# Patient Record
Sex: Female | Born: 1960 | Race: White | Hispanic: No | Marital: Married | Smoking: Never smoker
Health system: Southern US, Community
[De-identification: ages and names within clinical notes are randomized; demographics above are authoritative.]

## PROBLEM LIST (undated history)

## (undated) DIAGNOSIS — E669 Obesity, unspecified: Secondary | ICD-10-CM

## (undated) DIAGNOSIS — I10 Essential (primary) hypertension: Secondary | ICD-10-CM

## (undated) DIAGNOSIS — E785 Hyperlipidemia, unspecified: Secondary | ICD-10-CM

## (undated) DIAGNOSIS — H409 Unspecified glaucoma: Secondary | ICD-10-CM

## (undated) HISTORY — PX: FRACTURE SURGERY: SHX138

## (undated) HISTORY — PX: CHOLECYSTECTOMY: SHX55

## (undated) HISTORY — PX: TONSILLECTOMY: SUR1361

## (undated) HISTORY — PX: MANDIBLE SURGERY: SHX707

---

## 2005-05-07 ENCOUNTER — Ambulatory Visit: Payer: Self-pay

## 2008-08-21 ENCOUNTER — Ambulatory Visit: Payer: Self-pay

## 2009-12-16 ENCOUNTER — Ambulatory Visit: Payer: Self-pay | Admitting: Unknown Physician Specialty

## 2010-05-12 ENCOUNTER — Ambulatory Visit: Payer: Self-pay | Admitting: Gastroenterology

## 2011-05-28 ENCOUNTER — Ambulatory Visit: Payer: Self-pay | Admitting: Unknown Physician Specialty

## 2012-06-14 ENCOUNTER — Ambulatory Visit: Payer: Self-pay

## 2013-10-20 ENCOUNTER — Ambulatory Visit: Payer: Self-pay

## 2014-12-04 ENCOUNTER — Other Ambulatory Visit: Payer: Self-pay | Admitting: Unknown Physician Specialty

## 2014-12-04 DIAGNOSIS — Z1231 Encounter for screening mammogram for malignant neoplasm of breast: Secondary | ICD-10-CM

## 2014-12-07 ENCOUNTER — Ambulatory Visit
Admission: RE | Admit: 2014-12-07 | Discharge: 2014-12-07 | Disposition: A | Discharge status: Home / Self Care | Payer: BC Managed Care – PPO | Source: Ambulatory Visit | Attending: Unknown Physician Specialty | Admitting: Unknown Physician Specialty

## 2014-12-07 DIAGNOSIS — Z1231 Encounter for screening mammogram for malignant neoplasm of breast: Secondary | ICD-10-CM | POA: Diagnosis present

## 2015-08-16 ENCOUNTER — Encounter: Payer: Self-pay | Admitting: *Deleted

## 2015-08-19 ENCOUNTER — Ambulatory Visit
Admission: RE | Admit: 2015-08-19 | Discharge: 2015-08-19 | Disposition: A | Discharge status: Home / Self Care | Payer: BC Managed Care – PPO | Source: Ambulatory Visit | Attending: Gastroenterology | Admitting: Gastroenterology

## 2015-08-19 ENCOUNTER — Encounter: Payer: Self-pay | Admitting: *Deleted

## 2015-08-19 ENCOUNTER — Encounter
Admission: RE | Disposition: A | Discharge status: Home / Self Care | Payer: Self-pay | Source: Ambulatory Visit | Attending: Gastroenterology

## 2015-08-19 ENCOUNTER — Ambulatory Visit: Payer: BC Managed Care – PPO | Admitting: Anesthesiology

## 2015-08-19 DIAGNOSIS — Z800 Family history of malignant neoplasm of digestive organs: Secondary | ICD-10-CM | POA: Diagnosis present

## 2015-08-19 DIAGNOSIS — Z6841 Body Mass Index (BMI) 40.0 and over, adult: Secondary | ICD-10-CM | POA: Diagnosis not present

## 2015-08-19 DIAGNOSIS — Z1211 Encounter for screening for malignant neoplasm of colon: Secondary | ICD-10-CM | POA: Diagnosis not present

## 2015-08-19 DIAGNOSIS — D124 Benign neoplasm of descending colon: Secondary | ICD-10-CM | POA: Diagnosis not present

## 2015-08-19 DIAGNOSIS — H409 Unspecified glaucoma: Secondary | ICD-10-CM | POA: Insufficient documentation

## 2015-08-19 DIAGNOSIS — E785 Hyperlipidemia, unspecified: Secondary | ICD-10-CM | POA: Insufficient documentation

## 2015-08-19 DIAGNOSIS — Z880 Allergy status to penicillin: Secondary | ICD-10-CM | POA: Insufficient documentation

## 2015-08-19 DIAGNOSIS — I10 Essential (primary) hypertension: Secondary | ICD-10-CM | POA: Insufficient documentation

## 2015-08-19 DIAGNOSIS — Z9889 Other specified postprocedural states: Secondary | ICD-10-CM | POA: Insufficient documentation

## 2015-08-19 HISTORY — DX: Essential (primary) hypertension: I10

## 2015-08-19 HISTORY — PX: COLONOSCOPY WITH PROPOFOL: SHX5780

## 2015-08-19 HISTORY — DX: Hyperlipidemia, unspecified: E78.5

## 2015-08-19 HISTORY — DX: Unspecified glaucoma: H40.9

## 2015-08-19 SURGERY — COLONOSCOPY WITH PROPOFOL
Anesthesia: General

## 2015-08-19 MED ORDER — PROPOFOL 500 MG/50ML IV EMUL
INTRAVENOUS | Status: DC | PRN
  Administered 2015-08-19: 150 ug/kg/min via INTRAVENOUS

## 2015-08-19 MED ORDER — SODIUM CHLORIDE 0.9 % IV SOLN: INTRAVENOUS | Status: DC

## 2015-08-19 MED ORDER — SODIUM CHLORIDE 0.9 % IV SOLN
INTRAVENOUS | Status: DC
Start: 2015-08-19 — End: 2015-08-19
  Administered 2015-08-19: 1000 mL via INTRAVENOUS

## 2015-08-19 MED ORDER — PROPOFOL 10 MG/ML IV BOLUS
INTRAVENOUS | Status: DC | PRN
  Administered 2015-08-19: 100 mg via INTRAVENOUS

## 2015-08-19 NOTE — Transfer of Care (Signed)
Immediate Anesthesia Transfer of Care Note  Patient: Erin Holmes  Procedure(s) Performed: Procedure(s): COLONOSCOPY WITH PROPOFOL (N/A)  Patient Location: Endoscopy Unit  Anesthesia Type:General  Level of Consciousness: sedated  Airway & Oxygen Therapy: Patient Spontanous Breathing and Patient connected to nasal cannula oxygen  Post-op Assessment: Report given to RN and Post -op Vital signs reviewed and stable  Post vital signs: Reviewed and stable  Last Vitals:  Filed Vitals:   08/19/15 1450 08/19/15 1500  BP: 110/74 125/70  Pulse: 77 75  Temp:    Resp: 31 20    Complications: No apparent anesthesia complications

## 2015-08-19 NOTE — Anesthesia Postprocedure Evaluation (Signed)
Anesthesia Post Note  Patient: Erin Holmes  Procedure(s) Performed: Procedure(s) (LRB): COLONOSCOPY WITH PROPOFOL (N/A)  Patient location during evaluation: Endoscopy Anesthesia Type: General Level of consciousness: awake and alert Pain management: pain level controlled Vital Signs Assessment: post-procedure vital signs reviewed and stable Respiratory status: spontaneous breathing, nonlabored ventilation, respiratory function stable and patient connected to nasal cannula oxygen Cardiovascular status: blood pressure returned to baseline and stable Postop Assessment: no signs of nausea or vomiting Anesthetic complications: no    Last Vitals:  Filed Vitals:   08/19/15 1450 08/19/15 1500  BP: 110/74 125/70  Pulse: 77 75  Temp:    Resp: 31 20    Last Pain:  Filed Vitals:   08/19/15 1501  PainSc: 4                  Broadus John K Ezariah Nace

## 2015-08-19 NOTE — Anesthesia Preprocedure Evaluation (Signed)
Anesthesia Evaluation  Patient identified by MRN, date of birth, ID band Patient awake    Reviewed: Allergy & Precautions, H&P , NPO status , Patient's Chart, lab work & pertinent test results  History of Anesthesia Complications Negative for: history of anesthetic complications  Airway Mallampati: III  TM Distance: >3 FB Neck ROM: full    Dental  (+) Poor Dentition, Chipped, Caps   Pulmonary neg pulmonary ROS, neg shortness of breath,    Pulmonary exam normal breath sounds clear to auscultation       Cardiovascular Exercise Tolerance: Good hypertension, (-) angina(-) Past MI and (-) DOE Normal cardiovascular exam Rhythm:regular Rate:Normal     Neuro/Psych negative neurological ROS  negative psych ROS   GI/Hepatic negative GI ROS, Neg liver ROS, neg GERD  ,  Endo/Other  Morbid obesity  Renal/GU negative Renal ROS  negative genitourinary   Musculoskeletal   Abdominal   Peds  Hematology negative hematology ROS (+)   Anesthesia Other Findings Past Medical History:   Hypertension                                                 Hyperlipemia                                                 Glaucoma                                                    Past Surgical History:   TONSILLECTOMY                                                 MANDIBLE SURGERY                                              FRACTURE SURGERY                                              CHOLECYSTECTOMY                                               CESAREAN SECTION                                             BMI    Body Mass Index   41.60 kg/m 2      Reproductive/Obstetrics negative OB ROS  Anesthesia Physical Anesthesia Plan  ASA: III  Anesthesia Plan: General   Post-op Pain Management:    Induction:   Airway Management Planned:   Additional Equipment:   Intra-op Plan:    Post-operative Plan:   Informed Consent: I have reviewed the patients History and Physical, chart, labs and discussed the procedure including the risks, benefits and alternatives for the proposed anesthesia with the patient or authorized representative who has indicated his/her understanding and acceptance.   Dental Advisory Given  Plan Discussed with: Anesthesiologist, CRNA and Surgeon  Anesthesia Plan Comments:         Anesthesia Quick Evaluation

## 2015-08-19 NOTE — H&P (Signed)
Outpatient short stay form Pre-procedure 08/19/2015 1:57 PM Lollie Sails MD  Primary Physician: Rolan Lipa NP  Reason for visit:  Colonoscopy  History of present illness:  Patient is a 55 year old female presenting today for screening colonoscopy. She has a family history of colon cancer in her mother. She tolerated her preparation well. She takes no anticoagulation medications or aspirin products.    Current facility-administered medications:  .  0.9 %  sodium chloride infusion, , Intravenous, Continuous, Lollie Sails, MD, Last Rate: 20 mL/hr at 08/19/15 1354, 1,000 mL at 08/19/15 1354 .  0.9 %  sodium chloride infusion, , Intravenous, Continuous, Lollie Sails, MD  Prescriptions prior to admission  Medication Sig Dispense Refill Last Dose  . albuterol (PROVENTIL HFA;VENTOLIN HFA) 108 (90 Base) MCG/ACT inhaler Inhale 2 puffs into the lungs every 6 (six) hours as needed for wheezing or shortness of breath.     . lisinopril-hydrochlorothiazide (PRINZIDE,ZESTORETIC) 20-12.5 MG tablet Take 1 tablet by mouth daily.    at 0800  . loratadine (CLARITIN) 10 MG tablet Take 10 mg by mouth daily.   08/19/2015 at 0800 time  . Multiple Vitamin (MULTIVITAMIN) tablet Take 1 tablet by mouth daily.     . OMEGA 3-6-9 FATTY ACIDS PO Take 300 mg by mouth daily.     . timolol (BETIMOL) 0.25 % ophthalmic solution Place 1 drop into both eyes 2 (two) times daily.        Allergies  Allergen Reactions  . Penicillins Rash     Past Medical History  Diagnosis Date  . Hypertension   . Hyperlipemia   . Glaucoma     Review of systems:      Physical Exam    Heart and lungs: Regular rate and rhythm without rub or gallop, lungs are bilaterally clear.    HEENT: Normocephalic atraumatic eyes are anicteric    Other:     Pertinant exam for procedure: Soft nontender nondistended bowel sounds positive normoactive.    Planned proceedures: Colonoscopy with indicated procedures. I have  discussed the risks benefits and complications of procedures to include not limited to bleeding, infection, perforation and the risk of sedation and the patient wishes to proceed.    Lollie Sails, MD Gastroenterology 08/19/2015  1:57 PM

## 2015-08-19 NOTE — Op Note (Signed)
[facility] Gastroenterology Patient Name: Erin Holmes Procedure Date: 08/19/2015 2:02 PM MRN: [id number] Account #: [id number] Date of Birth: Dec 24, 1960 Admit Type: Outpatient Age: 55 Room: [facility] ENDO ROOM 3 Gender: Female Note Status: Finalized Procedure:         Colonoscopy Indications:       Family history of colon cancer in a first-degree relative Providers:         Lollie Sails, MD Referring MD:      Precious Bard, MD (Referring MD) Medicines:         Monitored Anesthesia Care Complications:     No immediate complications. Procedure:         Pre-Anesthesia Assessment:                    - ASA Grade Assessment: II - A patient with mild systemic                     disease.                    After obtaining informed consent, the colonoscope was                     passed under direct vision. Throughout the procedure, the                     patient's blood pressure, pulse, and oxygen saturations                     were monitored continuously. The Colonoscope was                     introduced through the anus and advanced to the the cecum,                     identified by appendiceal orifice and ileocecal valve. The                     quality of the bowel preparation was good. Findings:      A 1 mm polyp was found in the descending colon. The polyp was sessile.       The polyp was removed with a cold biopsy forceps. Resection and       retrieval were complete.      The retroflexed view of the distal rectum and anal verge was normal and       showed no anal or rectal abnormalities.      The digital rectal exam was normal. Impression:        - One 1 mm polyp in the descending colon. Resected and                     retrieved.                    - The distal rectum and anal verge are normal on                     retroflexion view. Recommendation:    - Await pathology results.                    - Telephone GI clinic for pathology results in  1 week. Procedure Code(s): --- Professional ---  45380, Colonoscopy, flexible; with biopsy, single or                     multiple Diagnosis Code(s): --- Professional ---                    D12.4, Benign neoplasm of descending colon                    Z80.0, Family history of malignant neoplasm of digestive                     organs CPT copyright 2014 American Medical Association. All rights reserved. The codes documented in this report are preliminary and upon coder review may  be revised to meet current compliance requirements. Lollie Sails, MD 08/19/2015 2:29:17 PM This report has been signed electronically. Number of Addenda: 0 Note Initiated On: 08/19/2015 2:02 PM Scope Withdrawal Time: 0 hours 8 minutes 0 seconds  Total Procedure Duration: 0 hours 19 minutes 45 seconds       [facility]

## 2015-08-20 ENCOUNTER — Encounter: Payer: Self-pay | Admitting: Gastroenterology

## 2015-08-21 LAB — SURGICAL PATHOLOGY

## 2015-10-31 ENCOUNTER — Other Ambulatory Visit: Payer: Self-pay | Admitting: Unknown Physician Specialty

## 2016-03-03 ENCOUNTER — Other Ambulatory Visit: Payer: Self-pay | Admitting: Physician Assistant

## 2016-03-03 DIAGNOSIS — Z1231 Encounter for screening mammogram for malignant neoplasm of breast: Secondary | ICD-10-CM

## 2016-03-24 ENCOUNTER — Ambulatory Visit: Payer: BC Managed Care – PPO

## 2016-03-25 ENCOUNTER — Ambulatory Visit
Admission: RE | Admit: 2016-03-25 | Discharge: 2016-03-25 | Disposition: A | Discharge status: Home / Self Care | Payer: BC Managed Care – PPO | Source: Ambulatory Visit | Attending: Physician Assistant | Admitting: Physician Assistant

## 2016-03-25 DIAGNOSIS — Z1231 Encounter for screening mammogram for malignant neoplasm of breast: Secondary | ICD-10-CM | POA: Insufficient documentation

## 2016-03-25 DIAGNOSIS — R928 Other abnormal and inconclusive findings on diagnostic imaging of breast: Secondary | ICD-10-CM | POA: Insufficient documentation

## 2016-03-27 ENCOUNTER — Other Ambulatory Visit: Payer: Self-pay | Admitting: Physician Assistant

## 2016-03-27 DIAGNOSIS — N6310 Unspecified lump in the right breast, unspecified quadrant: Secondary | ICD-10-CM

## 2016-04-16 ENCOUNTER — Ambulatory Visit
Admission: RE | Admit: 2016-04-16 | Discharge: 2016-04-16 | Disposition: A | Discharge status: Home / Self Care | Payer: BC Managed Care – PPO | Source: Ambulatory Visit | Attending: Physician Assistant | Admitting: Physician Assistant

## 2016-04-16 DIAGNOSIS — N6310 Unspecified lump in the right breast, unspecified quadrant: Secondary | ICD-10-CM | POA: Diagnosis not present

## 2016-09-07 ENCOUNTER — Other Ambulatory Visit: Payer: Self-pay | Admitting: Physician Assistant

## 2016-09-07 DIAGNOSIS — N6310 Unspecified lump in the right breast, unspecified quadrant: Secondary | ICD-10-CM

## 2016-10-16 ENCOUNTER — Other Ambulatory Visit: Payer: BC Managed Care – PPO

## 2016-10-16 ENCOUNTER — Inpatient Hospital Stay: Admission: RE | Admit: 2016-10-16 | Payer: BC Managed Care – PPO | Source: Ambulatory Visit

## 2016-10-29 ENCOUNTER — Ambulatory Visit
Admission: RE | Admit: 2016-10-29 | Discharge: 2016-10-29 | Disposition: A | Discharge status: Home / Self Care | Payer: BC Managed Care – PPO | Source: Ambulatory Visit | Attending: Physician Assistant | Admitting: Physician Assistant

## 2016-10-29 DIAGNOSIS — N6311 Unspecified lump in the right breast, upper outer quadrant: Secondary | ICD-10-CM | POA: Insufficient documentation

## 2016-10-29 DIAGNOSIS — N6310 Unspecified lump in the right breast, unspecified quadrant: Secondary | ICD-10-CM

## 2017-03-04 ENCOUNTER — Other Ambulatory Visit: Payer: Self-pay | Admitting: Physician Assistant

## 2017-03-04 DIAGNOSIS — Z1239 Encounter for other screening for malignant neoplasm of breast: Secondary | ICD-10-CM

## 2017-03-04 DIAGNOSIS — N6001 Solitary cyst of right breast: Secondary | ICD-10-CM

## 2017-05-24 ENCOUNTER — Other Ambulatory Visit: Payer: Self-pay | Admitting: Physician Assistant

## 2017-05-24 DIAGNOSIS — Z1239 Encounter for other screening for malignant neoplasm of breast: Secondary | ICD-10-CM

## 2017-05-24 DIAGNOSIS — N6001 Solitary cyst of right breast: Secondary | ICD-10-CM

## 2017-07-30 ENCOUNTER — Ambulatory Visit
Admission: RE | Admit: 2017-07-30 | Discharge: 2017-07-30 | Disposition: A | Discharge status: Home / Self Care | Payer: BC Managed Care – PPO | Source: Ambulatory Visit | Attending: Physician Assistant | Admitting: Physician Assistant

## 2017-07-30 DIAGNOSIS — Z1231 Encounter for screening mammogram for malignant neoplasm of breast: Secondary | ICD-10-CM | POA: Insufficient documentation

## 2017-07-30 DIAGNOSIS — Z1239 Encounter for other screening for malignant neoplasm of breast: Secondary | ICD-10-CM

## 2017-07-30 DIAGNOSIS — N6001 Solitary cyst of right breast: Secondary | ICD-10-CM | POA: Diagnosis present

## 2018-03-09 ENCOUNTER — Other Ambulatory Visit: Payer: Self-pay | Admitting: Physician Assistant

## 2018-03-09 DIAGNOSIS — Z1239 Encounter for other screening for malignant neoplasm of breast: Secondary | ICD-10-CM

## 2018-03-09 DIAGNOSIS — N6001 Solitary cyst of right breast: Secondary | ICD-10-CM

## 2018-09-06 ENCOUNTER — Other Ambulatory Visit: Payer: Self-pay | Admitting: Physician Assistant

## 2018-09-06 DIAGNOSIS — N6001 Solitary cyst of right breast: Secondary | ICD-10-CM

## 2018-09-06 DIAGNOSIS — Z1239 Encounter for other screening for malignant neoplasm of breast: Secondary | ICD-10-CM

## 2018-09-16 ENCOUNTER — Ambulatory Visit
Admission: RE | Admit: 2018-09-16 | Discharge: 2018-09-16 | Disposition: A | Discharge status: Home / Self Care | Payer: BC Managed Care – PPO | Source: Ambulatory Visit | Attending: Physician Assistant | Admitting: Physician Assistant

## 2018-09-16 DIAGNOSIS — N6001 Solitary cyst of right breast: Secondary | ICD-10-CM

## 2018-09-16 DIAGNOSIS — Z1239 Encounter for other screening for malignant neoplasm of breast: Secondary | ICD-10-CM

## 2020-04-24 ENCOUNTER — Other Ambulatory Visit: Payer: Self-pay | Admitting: Physician Assistant

## 2020-04-24 DIAGNOSIS — Z1231 Encounter for screening mammogram for malignant neoplasm of breast: Secondary | ICD-10-CM

## 2020-05-23 ENCOUNTER — Other Ambulatory Visit: Payer: Self-pay

## 2020-05-23 ENCOUNTER — Ambulatory Visit
Admission: RE | Admit: 2020-05-23 | Discharge: 2020-05-23 | Disposition: A | Discharge status: Home / Self Care | Payer: BC Managed Care – PPO | Source: Ambulatory Visit | Attending: Physician Assistant | Admitting: Physician Assistant

## 2020-05-23 DIAGNOSIS — Z1231 Encounter for screening mammogram for malignant neoplasm of breast: Secondary | ICD-10-CM | POA: Insufficient documentation

## 2021-03-28 ENCOUNTER — Other Ambulatory Visit: Payer: Self-pay | Admitting: Physician Assistant

## 2021-03-28 DIAGNOSIS — Z1231 Encounter for screening mammogram for malignant neoplasm of breast: Secondary | ICD-10-CM

## 2021-05-26 ENCOUNTER — Ambulatory Visit
Admission: RE | Admit: 2021-05-26 | Discharge: 2021-05-26 | Disposition: A | Discharge status: Home / Self Care | Payer: BC Managed Care – PPO | Source: Ambulatory Visit | Attending: Physician Assistant | Admitting: Physician Assistant

## 2021-05-26 ENCOUNTER — Other Ambulatory Visit: Payer: Self-pay

## 2021-05-26 DIAGNOSIS — Z1231 Encounter for screening mammogram for malignant neoplasm of breast: Secondary | ICD-10-CM | POA: Diagnosis not present

## 2021-07-18 ENCOUNTER — Encounter: Payer: Self-pay | Admitting: *Deleted

## 2021-07-21 ENCOUNTER — Other Ambulatory Visit: Payer: Self-pay

## 2021-07-21 ENCOUNTER — Encounter: Payer: Self-pay | Admitting: Anesthesiology

## 2021-07-21 ENCOUNTER — Encounter
Admission: RE | Disposition: A | Discharge status: Home / Self Care | Payer: Self-pay | Source: Home / Self Care | Attending: Gastroenterology

## 2021-07-21 ENCOUNTER — Ambulatory Visit: Payer: BC Managed Care – PPO | Admitting: Anesthesiology

## 2021-07-21 ENCOUNTER — Ambulatory Visit
Admission: RE | Admit: 2021-07-21 | Discharge: 2021-07-21 | Disposition: A | Discharge status: Home / Self Care | Payer: BC Managed Care – PPO | Attending: Gastroenterology | Admitting: Gastroenterology

## 2021-07-21 DIAGNOSIS — R000 Tachycardia, unspecified: Secondary | ICD-10-CM | POA: Insufficient documentation

## 2021-07-21 DIAGNOSIS — I10 Essential (primary) hypertension: Secondary | ICD-10-CM | POA: Insufficient documentation

## 2021-07-21 DIAGNOSIS — K5730 Diverticulosis of large intestine without perforation or abscess without bleeding: Secondary | ICD-10-CM | POA: Diagnosis not present

## 2021-07-21 DIAGNOSIS — Z86010 Personal history of colonic polyps: Secondary | ICD-10-CM | POA: Diagnosis not present

## 2021-07-21 DIAGNOSIS — Z800 Family history of malignant neoplasm of digestive organs: Secondary | ICD-10-CM | POA: Insufficient documentation

## 2021-07-21 DIAGNOSIS — K648 Other hemorrhoids: Secondary | ICD-10-CM | POA: Insufficient documentation

## 2021-07-21 DIAGNOSIS — K5520 Angiodysplasia of colon without hemorrhage: Secondary | ICD-10-CM | POA: Insufficient documentation

## 2021-07-21 DIAGNOSIS — K640 First degree hemorrhoids: Secondary | ICD-10-CM | POA: Insufficient documentation

## 2021-07-21 DIAGNOSIS — Z6841 Body Mass Index (BMI) 40.0 and over, adult: Secondary | ICD-10-CM | POA: Diagnosis not present

## 2021-07-21 DIAGNOSIS — Z1211 Encounter for screening for malignant neoplasm of colon: Secondary | ICD-10-CM | POA: Insufficient documentation

## 2021-07-21 DIAGNOSIS — E785 Hyperlipidemia, unspecified: Secondary | ICD-10-CM | POA: Diagnosis not present

## 2021-07-21 DIAGNOSIS — E669 Obesity, unspecified: Secondary | ICD-10-CM | POA: Insufficient documentation

## 2021-07-21 DIAGNOSIS — Z9049 Acquired absence of other specified parts of digestive tract: Secondary | ICD-10-CM | POA: Insufficient documentation

## 2021-07-21 DIAGNOSIS — H409 Unspecified glaucoma: Secondary | ICD-10-CM | POA: Insufficient documentation

## 2021-07-21 HISTORY — DX: Obesity, unspecified: E66.9

## 2021-07-21 HISTORY — PX: COLONOSCOPY WITH PROPOFOL: SHX5780

## 2021-07-21 SURGERY — COLONOSCOPY WITH PROPOFOL
Anesthesia: General

## 2021-07-21 MED ORDER — SODIUM CHLORIDE 0.9 % IV SOLN
INTRAVENOUS | Status: DC
  Administered 2021-07-21: 20 mL via INTRAVENOUS

## 2021-07-21 MED ORDER — STERILE WATER FOR IRRIGATION IR SOLN
Status: DC | PRN
  Administered 2021-07-21 (×2): 60 mL

## 2021-07-21 MED ORDER — PROPOFOL 500 MG/50ML IV EMUL
INTRAVENOUS | Status: AC
  Filled 2021-07-21: qty 50

## 2021-07-21 MED ORDER — PROPOFOL 500 MG/50ML IV EMUL
INTRAVENOUS | Status: DC | PRN
  Administered 2021-07-21: 150 ug/kg/min via INTRAVENOUS

## 2021-07-21 NOTE — Anesthesia Procedure Notes (Signed)
Date/Time: 07/21/2021 12:50 PM Performed by: Vaughan Sine Pre-anesthesia Checklist: Patient identified, Emergency Drugs available, Suction available, Patient being monitored and Timeout performed Patient Re-evaluated:Patient Re-evaluated prior to induction Oxygen Delivery Method: Simple face mask Preoxygenation: Pre-oxygenation with 100% oxygen Induction Type: IV induction Placement Confirmation: positive ETCO2 and CO2 detector

## 2021-07-21 NOTE — Interval H&P Note (Signed)
History and Physical Interval Note:  07/21/2021 12:42 PM  Erin Holmes  has presented today for surgery, with the diagnosis of Personal hx of polyps.  The various methods of treatment have been discussed with the patient and family. After consideration of risks, benefits and other options for treatment, the patient has consented to  Procedure(s): COLONOSCOPY WITH PROPOFOL (N/A) as a surgical intervention.  The patient's history has been reviewed, patient examined, no change in status, stable for surgery.  I have reviewed the patient's chart and labs.  Questions were answered to the patient's satisfaction.     Lesly Rubenstein  Ok to proceed with colonoscopy

## 2021-07-21 NOTE — Transfer of Care (Signed)
Immediate Anesthesia Transfer of Care Note  Patient: Erin Holmes  Procedure(s) Performed: COLONOSCOPY WITH PROPOFOL  Patient Location: PACU  Anesthesia Type:General  Level of Consciousness: awake and sedated  Airway & Oxygen Therapy: Patient Spontanous Breathing and Patient connected to face mask oxygen  Post-op Assessment: Report given to RN and Post -op Vital signs reviewed and stable  Post vital signs: Reviewed and stable  Last Vitals:  Vitals Value Taken Time  BP    Temp    Pulse    Resp    SpO2      Last Pain:  Vitals:   07/21/21 1151  TempSrc: Temporal  PainSc: 0-No pain         Complications: No notable events documented.

## 2021-07-21 NOTE — Anesthesia Postprocedure Evaluation (Signed)
Anesthesia Post Note  Patient: Erin Holmes  Procedure(s) Performed: COLONOSCOPY WITH PROPOFOL  Patient location during evaluation: Endoscopy Anesthesia Type: General Level of consciousness: awake and alert Pain management: pain level controlled Vital Signs Assessment: post-procedure vital signs reviewed and stable Respiratory status: spontaneous breathing, nonlabored ventilation, respiratory function stable and patient connected to nasal cannula oxygen Cardiovascular status: blood pressure returned to baseline and stable Postop Assessment: no apparent nausea or vomiting Anesthetic complications: no   No notable events documented.   Last Vitals:  Vitals:   07/21/21 1322 07/21/21 1332  BP: (!) 100/55 113/66  Pulse: (!) 121 99  Resp: (!) 33 18  Temp:    SpO2: 100% 99%    Last Pain:  Vitals:   07/21/21 1332  TempSrc:   PainSc: 0-No pain                 Precious Haws Viviana Trimble

## 2021-07-21 NOTE — Op Note (Signed)
[facility] Gastroenterology Patient Name: Erin Holmes Procedure Date: 07/21/2021 12:15 PM MRN: [id number] Account #: [id number] Date of Birth: 1960/09/14 Admit Type: Outpatient Age: 61 Room: [facility] ENDO ROOM 3 Gender: Female Note Status: Finalized Instrument Name: Park Meo [id number] Procedure:             Colonoscopy Indications:           Surveillance: Personal history of adenomatous polyps                         on last colonoscopy 5 years ago Providers:             Andrey Farmer MD, MD Referring MD:          Precious Bard, MD (Referring MD) Medicines:             Monitored Anesthesia Care Complications:         No immediate complications. Procedure:             Pre-Anesthesia Assessment:                        - Prior to the procedure, a History and Physical was                         performed, and patient medications and allergies were                         reviewed. The patient is competent. The risks and                         benefits of the procedure and the sedation options and                         risks were discussed with the patient. All questions                         were answered and informed consent was obtained.                         Patient identification and proposed procedure were                         verified by the physician, the nurse, the                         anesthesiologist, the anesthetist and the technician                         in the endoscopy suite. Mental Status Examination:                         alert and oriented. Airway Examination: normal                         oropharyngeal airway and neck mobility. Respiratory                         Examination: clear to auscultation. CV Examination:  normal. Prophylactic Antibiotics: The patient does not                         require prophylactic antibiotics. Prior                         Anticoagulants: The patient has taken no  previous                         anticoagulant or antiplatelet agents. ASA Grade                         Assessment: II - A patient with mild systemic disease.                         After reviewing the risks and benefits, the patient                         was deemed in satisfactory condition to undergo the                         procedure. The anesthesia plan was to use monitored                         anesthesia care (MAC). Immediately prior to                         administration of medications, the patient was                         re-assessed for adequacy to receive sedatives. The                         heart rate, respiratory rate, oxygen saturations,                         blood pressure, adequacy of pulmonary ventilation, and                         response to care were monitored throughout the                         procedure. The physical status of the patient was                         re-assessed after the procedure.                        After obtaining informed consent, the colonoscope was                         passed under direct vision. Throughout the procedure,                         the patient's blood pressure, pulse, and oxygen                         saturations were monitored continuously. The  Colonoscope was introduced through the anus and                         advanced to the the cecum, identified by appendiceal                         orifice and ileocecal valve. The colonoscopy was                         performed without difficulty. The patient tolerated                         the procedure well. The quality of the bowel                         preparation was adequate to identify polyps. Findings:      The perianal and digital rectal examinations were normal.      Multiple small-mouthed diverticula were found in the sigmoid colon and       descending colon.      A single medium-sized localized angiodysplastic lesion  without bleeding       was found in the recto-sigmoid colon.      Internal hemorrhoids were found during retroflexion. The hemorrhoids       were Grade I (internal hemorrhoids that do not prolapse).      The exam was otherwise without abnormality on direct and retroflexion       views. Impression:            - Diverticulosis in the sigmoid colon and in the                         descending colon.                        - A single non-bleeding colonic angiodysplastic lesion.                        - Internal hemorrhoids.                        - The examination was otherwise normal on direct and                         retroflexion views.                        - No specimens collected. Recommendation:        - Discharge patient to home.                        - Resume previous diet.                        - Continue present medications.                        - Repeat colonoscopy in 5 years for surveillance.                        - Return to referring physician as previously  scheduled. Procedure Code(s):     --- Professional ---                        Z5638, Colorectal cancer screening; colonoscopy on                         individual at high risk Diagnosis Code(s):     --- Professional ---                        Z86.010, Personal history of colonic polyps                        K64.0, First degree hemorrhoids                        K55.20, Angiodysplasia of colon without hemorrhage                        K57.30, Diverticulosis of large intestine without                         perforation or abscess without bleeding CPT copyright 2019 American Medical Association. All rights reserved. The codes documented in this report are preliminary and upon coder review may  be revised to meet current compliance requirements. Andrey Farmer MD, MD 07/21/2021 1:19:38 PM Number of Addenda: 0 Note Initiated On: 07/21/2021 12:15 PM Scope Withdrawal Time: 0 hours 7 minutes  31 seconds  Total Procedure Duration: 0 hours 14 minutes 0 seconds  Estimated Blood Loss:  Estimated blood loss: none.      [facility]

## 2021-07-21 NOTE — Anesthesia Preprocedure Evaluation (Signed)
Anesthesia Evaluation  Patient identified by MRN, date of birth, ID band Patient awake    Reviewed: Allergy & Precautions, NPO status , Patient's Chart, lab work & pertinent test results  History of Anesthesia Complications Negative for: history of anesthetic complications  Airway Mallampati: III  TM Distance: <3 FB Neck ROM: full    Dental  (+) Chipped   Pulmonary neg shortness of breath, asthma ,    Pulmonary exam normal        Cardiovascular Exercise Tolerance: Good hypertension, (-) angina(-) Past MI and (-) DOE + dysrhythmias  Rate:Tachycardia     Neuro/Psych negative neurological ROS  negative psych ROS   GI/Hepatic negative GI ROS, Neg liver ROS, neg GERD  ,  Endo/Other  negative endocrine ROS  Renal/GU negative Renal ROS  negative genitourinary   Musculoskeletal   Abdominal   Peds  Hematology negative hematology ROS (+)   Anesthesia Other Findings Past Medical History: No date: Glaucoma No date: Hyperlipemia No date: Hypertension No date: Obesity     Comment:  BMI 47.52  Past Surgical History: No date: CESAREAN SECTION No date: CHOLECYSTECTOMY 08/19/2015: COLONOSCOPY WITH PROPOFOL; N/A     Comment:  Procedure: COLONOSCOPY WITH PROPOFOL;  Surgeon: Lollie Sails, MD;  Location: [facility] ENDOSCOPY;  Service:               Endoscopy;  Laterality: N/A; No date: FRACTURE SURGERY No date: MANDIBLE SURGERY No date: TONSILLECTOMY  BMI    Body Mass Index: 42.91 kg/m      Reproductive/Obstetrics negative OB ROS                             Anesthesia Physical Anesthesia Plan  ASA: 3  Anesthesia Plan: General   Post-op Pain Management:    Induction: Intravenous  PONV Risk Score and Plan: Propofol infusion and TIVA  Airway Management Planned: Natural Airway and Nasal Cannula  Additional Equipment:   Intra-op Plan:   Post-operative Plan:   Informed  Consent: I have reviewed the patients History and Physical, chart, labs and discussed the procedure including the risks, benefits and alternatives for the proposed anesthesia with the patient or authorized representative who has indicated his/her understanding and acceptance.     Dental Advisory Given  Plan Discussed with: Anesthesiologist, CRNA and Surgeon  Anesthesia Plan Comments: (Patient consented for risks of anesthesia including but not limited to:  - adverse reactions to medications - risk of airway placement if required - damage to eyes, teeth, lips or other oral mucosa - nerve damage due to positioning  - sore throat or hoarseness - Damage to heart, brain, nerves, lungs, other parts of body or loss of life  Patient voiced understanding.)        Anesthesia Quick Evaluation

## 2021-07-21 NOTE — H&P (Signed)
Outpatient short stay form Pre-procedure 07/21/2021  Erin Rubenstein, MD  Primary Physician: Marinda Elk, MD  Reason for visit:  Surveillance  History of present illness:   61 y/o lady with history of adenomatous polyp on colonoscopy done in 2017. Also with history of hypertension and HLD. No blood thinners. History of c-section. Mother had colon cancer in polyp in her late 66's.    Current Facility-Administered Medications:    0.9 %  sodium chloride infusion, , Intravenous, Continuous, Cosby Proby, Hilton Cork, MD, Last Rate: 20 mL/hr at 07/21/21 1208, 20 mL at 07/21/21 1208  Medications Prior to Admission  Medication Sig Dispense Refill Last Dose   cetirizine (ZYRTEC) 10 MG tablet Take 10 mg by mouth daily.   Past Week   latanoprost (XALATAN) 0.005 % ophthalmic solution Place 1 drop into both eyes at bedtime.      lisinopril-hydrochlorothiazide (PRINZIDE,ZESTORETIC) 20-12.5 MG tablet Take 1 tablet by mouth daily.   07/21/2021 at 0800   Multiple Vitamin (MULTIVITAMIN) tablet Take 1 tablet by mouth daily.   Past Week   OMEGA 3-6-9 FATTY ACIDS PO Take 300 mg by mouth daily.   Past Week   rosuvastatin (CRESTOR) 5 MG tablet Take 5 mg by mouth daily.   07/20/2021   vitamin C (ASCORBIC ACID) 500 MG tablet Take 500 mg by mouth daily.   Past Week   albuterol (PROVENTIL HFA;VENTOLIN HFA) 108 (90 Base) MCG/ACT inhaler Inhale 2 puffs into the lungs every 6 (six) hours as needed for wheezing or shortness of breath.      loratadine (CLARITIN) 10 MG tablet Take 10 mg by mouth daily.      timolol (BETIMOL) 0.25 % ophthalmic solution Place 1 drop into both eyes 2 (two) times daily.        Allergies  Allergen Reactions   Penicillins Rash     Past Medical History:  Diagnosis Date   Glaucoma    Hyperlipemia    Hypertension    Obesity    BMI 47.52    Review of systems:  Otherwise negative.    Physical Exam  Gen: Alert, oriented. Appears stated age.  HEENT: PERRLA. Lungs: No  respiratory distress CV: RRR Abd: soft, benign, no masses Ext: No edema    Planned procedures: Proceed with colonoscopy. The patient understands the nature of the planned procedure, indications, risks, alternatives and potential complications including but not limited to bleeding, infection, perforation, damage to internal organs and possible oversedation/side effects from anesthesia. The patient agrees and gives consent to proceed.  Please refer to procedure notes for findings, recommendations and patient disposition/instructions.     Erin Rubenstein, MD [facility] Gastroenterology

## 2022-04-07 ENCOUNTER — Other Ambulatory Visit: Payer: Self-pay | Admitting: Physician Assistant

## 2022-04-07 DIAGNOSIS — Z1231 Encounter for screening mammogram for malignant neoplasm of breast: Secondary | ICD-10-CM

## 2022-05-29 ENCOUNTER — Ambulatory Visit
Admission: RE | Admit: 2022-05-29 | Discharge: 2022-05-29 | Disposition: A | Discharge status: Home / Self Care | Payer: BC Managed Care – PPO | Source: Ambulatory Visit | Attending: Physician Assistant | Admitting: Physician Assistant

## 2022-05-29 DIAGNOSIS — Z1231 Encounter for screening mammogram for malignant neoplasm of breast: Secondary | ICD-10-CM | POA: Insufficient documentation

## 2023-02-13 IMAGING — MG MM DIGITAL SCREENING BILAT W/ TOMO AND CAD
6 of 10 series · 6 of 30 positions shown · non-contrast
Comparison: Previous exam(s).

CLINICAL DATA: Screening.

EXAM:
DIGITAL SCREENING BILATERAL MAMMOGRAM WITH TOMOSYNTHESIS AND CAD
TECHNIQUE: Bilateral screening digital craniocaudal and mediolateral oblique
mammograms were obtained. Bilateral screening digital breast
tomosynthesis was performed. The images were evaluated with
computer-aided detection.

[R MLO synth-2D]
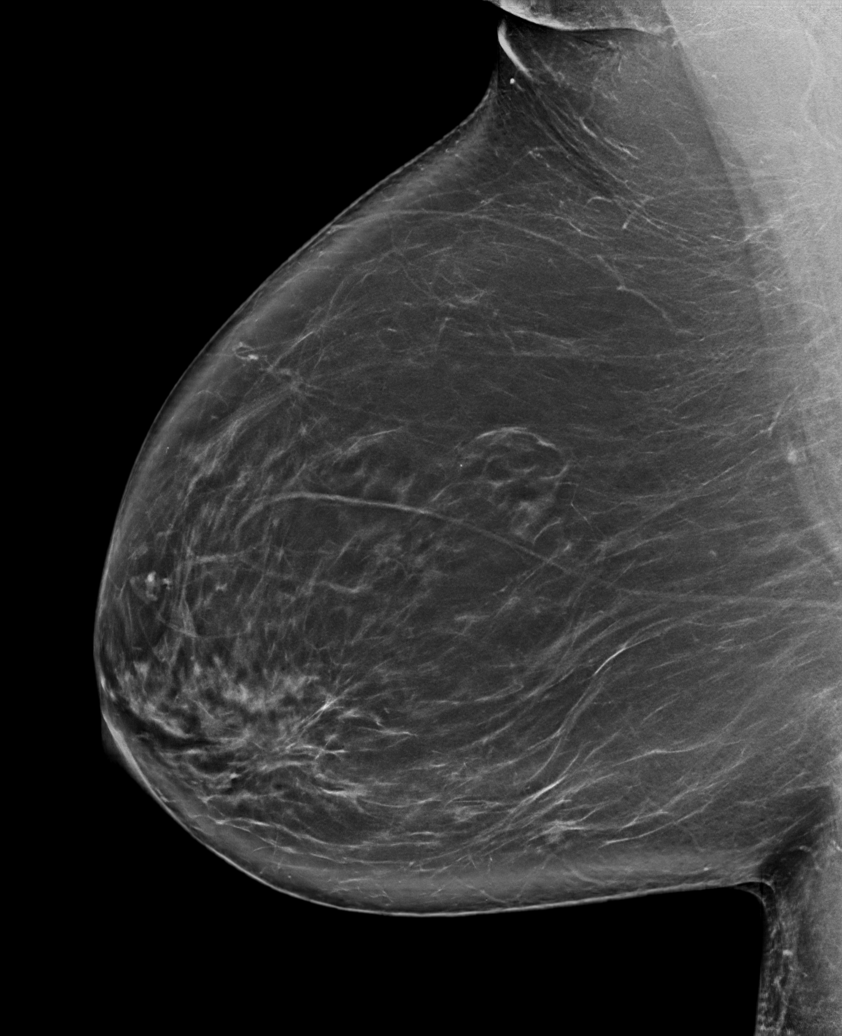

[R CC synth-2D]
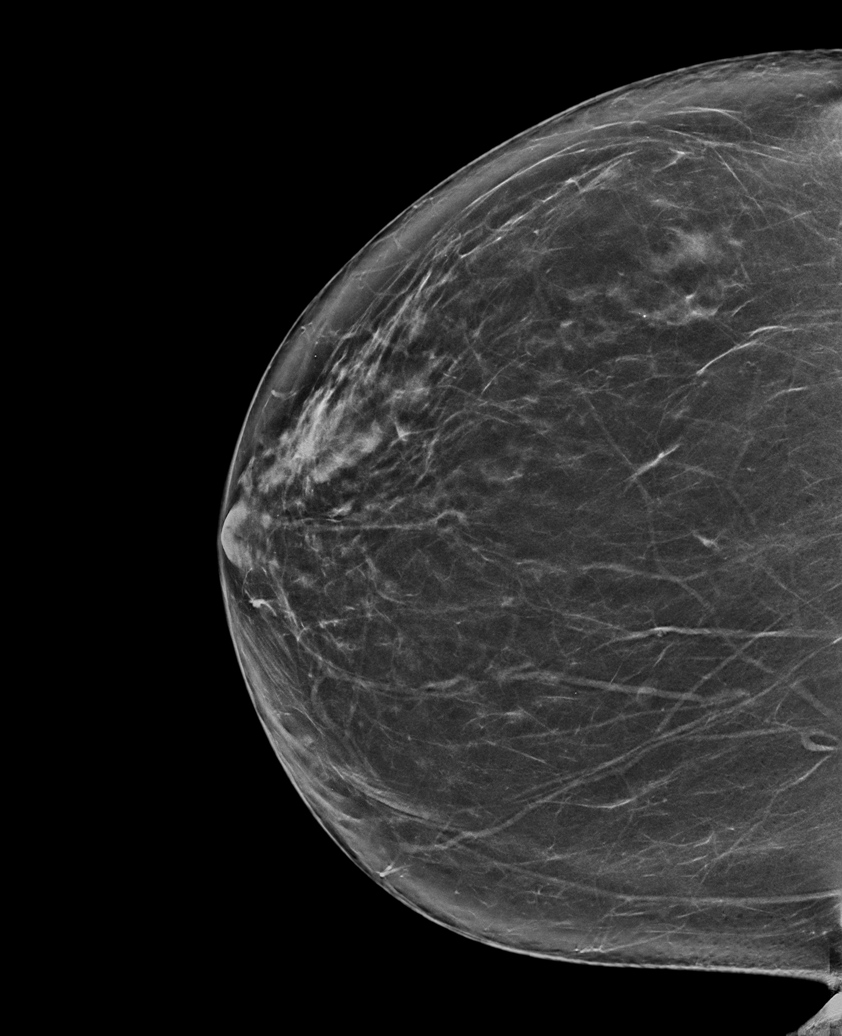

[L XCCL synth-2D]
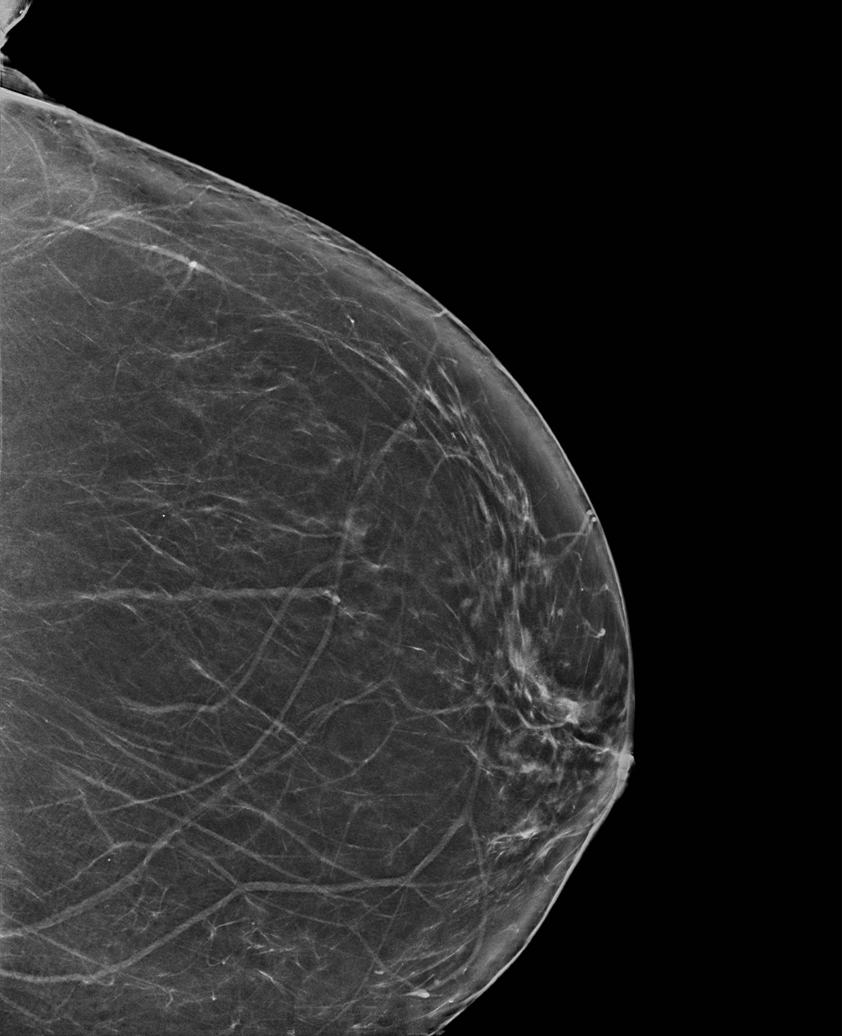

[L MLO synth-2D]
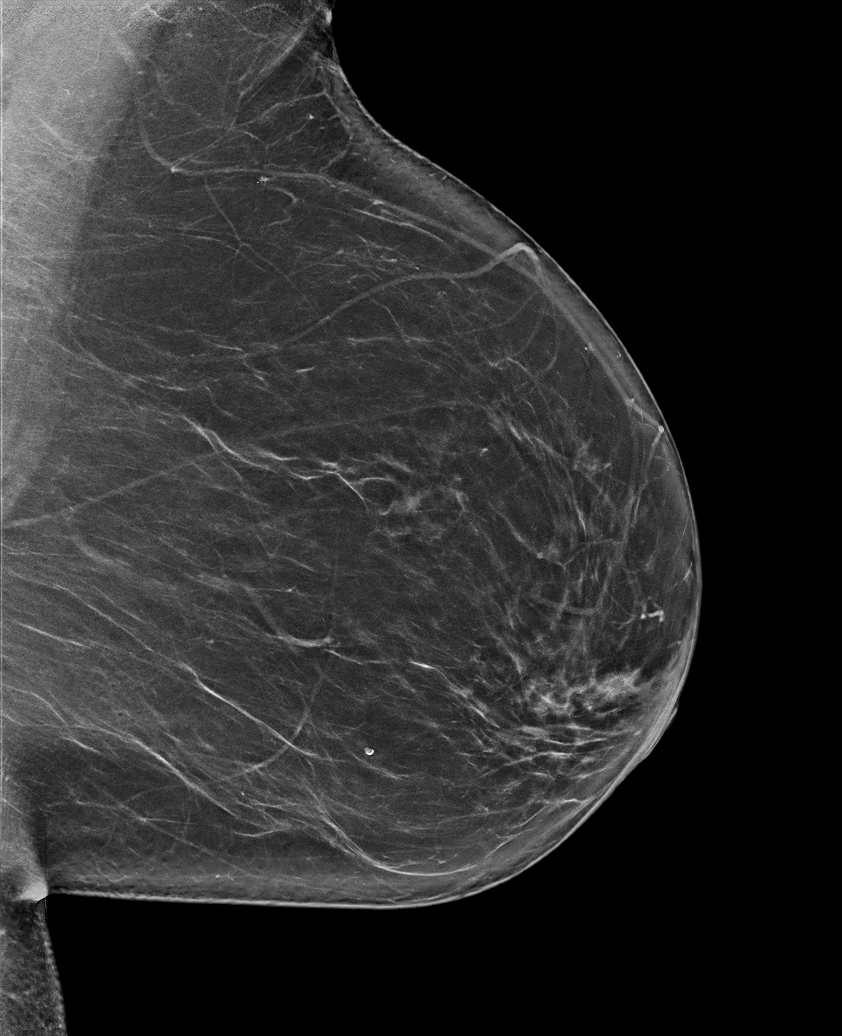

[L CC synth-2D]
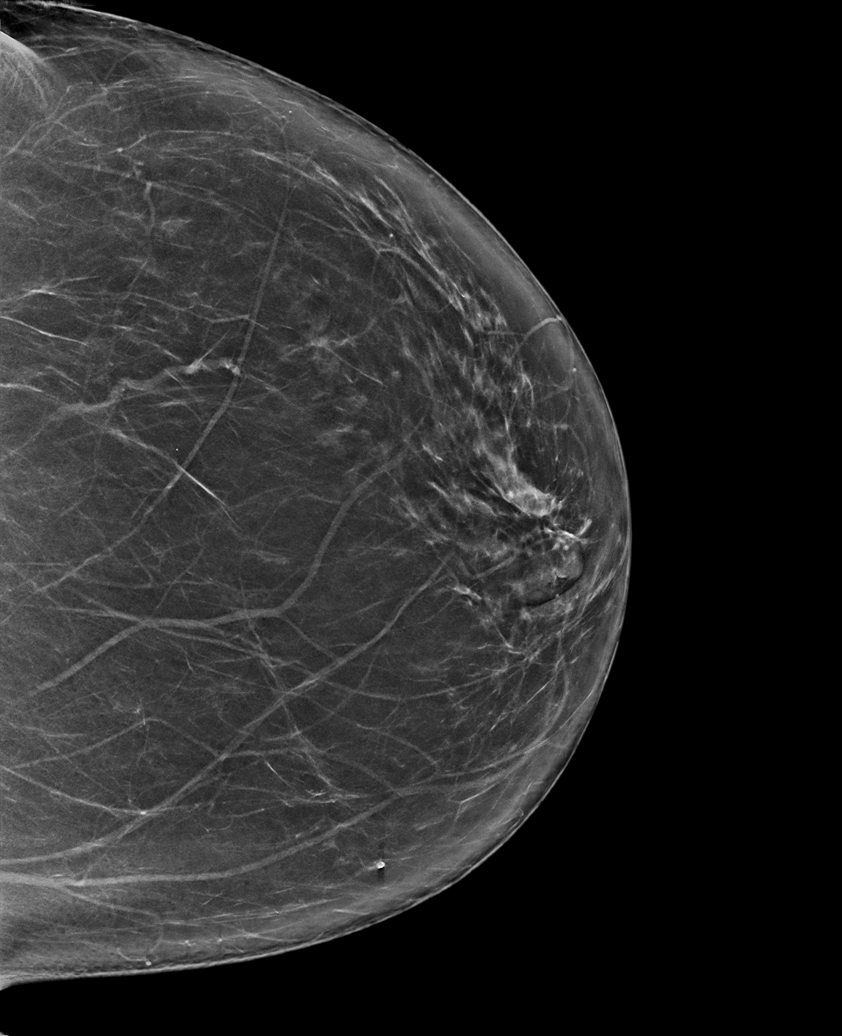

[L MLO tomo · tomo slice 45/90.0]
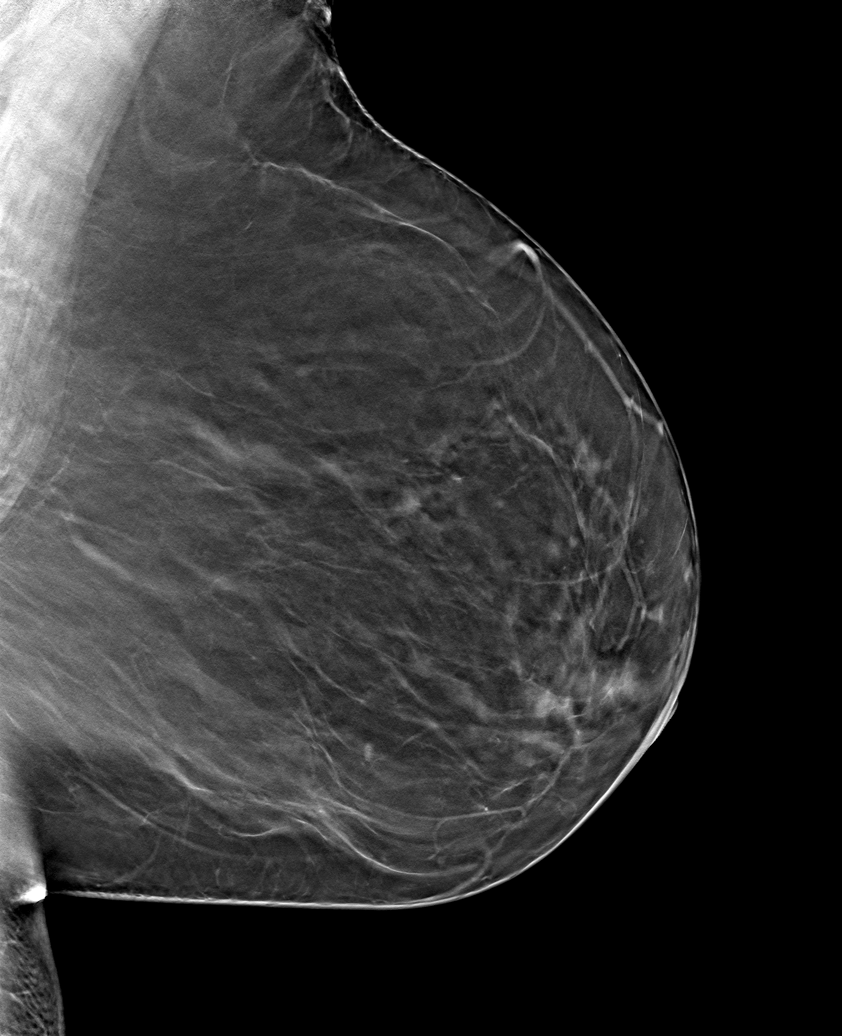

[6 of 30 positions shown; findings below may reference images not displayed]

ACR Breast Density Category b: There are scattered areas of
fibroglandular density.
FINDINGS: There are no findings suspicious for malignancy.
IMPRESSION: No mammographic evidence of malignancy. A result letter of this
screening mammogram will be mailed directly to the patient.

RECOMMENDATION:
Screening mammogram in one year. (Code:51-O-LD2)

BI-RADS CATEGORY  1: Negative.

## 2023-05-07 ENCOUNTER — Other Ambulatory Visit: Payer: Self-pay | Admitting: Physician Assistant

## 2023-05-07 DIAGNOSIS — Z1231 Encounter for screening mammogram for malignant neoplasm of breast: Secondary | ICD-10-CM

## 2023-06-02 ENCOUNTER — Ambulatory Visit
Admission: RE | Admit: 2023-06-02 | Discharge: 2023-06-02 | Disposition: A | Discharge status: Home / Self Care | Payer: BC Managed Care – PPO | Source: Ambulatory Visit | Attending: Physician Assistant | Admitting: Physician Assistant

## 2023-06-02 DIAGNOSIS — Z1231 Encounter for screening mammogram for malignant neoplasm of breast: Secondary | ICD-10-CM | POA: Insufficient documentation

## 2024-05-19 ENCOUNTER — Other Ambulatory Visit: Payer: Self-pay | Admitting: Physician Assistant

## 2024-05-19 DIAGNOSIS — Z1231 Encounter for screening mammogram for malignant neoplasm of breast: Secondary | ICD-10-CM

## 2024-06-02 ENCOUNTER — Ambulatory Visit
Admission: RE | Admit: 2024-06-02 | Discharge: 2024-06-02 | Disposition: A | Discharge status: Home / Self Care | Payer: Self-pay | Source: Ambulatory Visit | Attending: Physician Assistant | Admitting: Physician Assistant

## 2024-06-02 DIAGNOSIS — Z1231 Encounter for screening mammogram for malignant neoplasm of breast: Secondary | ICD-10-CM | POA: Insufficient documentation
